# Patient Record
Sex: Female | Born: 1990 | Race: White
Health system: Northeastern US, Academic
[De-identification: ages and names within clinical notes are randomized; demographics above are authoritative.]

## PROBLEM LIST (undated history)

## (undated) DIAGNOSIS — Z720 Tobacco use: Secondary | ICD-10-CM

## (undated) HISTORY — PX: TONSILLECTOMY: SUR1361

## (undated) HISTORY — PX: HERNIA REPAIR: SHX51

## (undated) HISTORY — DX: Morbid (severe) obesity due to excess calories: E66.01

## (undated) HISTORY — DX: Tobacco use: Z72.0

## (undated) HISTORY — PX: TONSILLECTOMY AND ADENOIDECTOMY: SHX28

## (undated) HISTORY — PX: ABDOMINAL HERNIA REPAIR: SHX539

---
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
## 2011-09-25 ENCOUNTER — Encounter: Payer: Self-pay | Admitting: Pulmonology

## 2011-09-25 ENCOUNTER — Ambulatory Visit: Payer: Self-pay | Admitting: Pulmonology

## 2011-09-25 VITALS — BP 128/72 | HR 86 | Temp 97.5°F | Resp 12 | Ht 63.0 in | Wt 296.5 lb

## 2011-09-25 DIAGNOSIS — R590 Localized enlarged lymph nodes: Secondary | ICD-10-CM | POA: Insufficient documentation

## 2011-09-25 DIAGNOSIS — F17200 Nicotine dependence, unspecified, uncomplicated: Secondary | ICD-10-CM

## 2011-09-25 DIAGNOSIS — R911 Solitary pulmonary nodule: Secondary | ICD-10-CM

## 2011-09-25 LAB — CBC AND DIFFERENTIAL
Baso # K/uL: 0 10*3/uL (ref 0.0–0.1)
Basophil %: 0.3 % (ref 0.1–1.2)
Eos # K/uL: 0.1 10*3/uL (ref 0.0–0.4)
Eosinophil %: 0.8 % (ref 0.7–5.8)
Hematocrit: 42 % (ref 34–45)
Hemoglobin: 13.7 g/dL (ref 11.2–15.7)
Lymph # K/uL: 2 10*3/uL (ref 1.2–3.7)
Lymphocyte %: 20.1 % (ref 19.3–51.7)
MCV: 86 fL (ref 79–95)
Mono # K/uL: 0.8 10*3/uL (ref 0.2–0.9)
Monocyte %: 8.1 % (ref 4.7–12.5)
Neut # K/uL: 6.9 10*3/uL — ABNORMAL HIGH (ref 1.6–6.1)
Platelets: 302 10*3/uL (ref 160–370)
RBC: 4.9 MIL/uL (ref 3.9–5.2)
RDW: 13.4 % (ref 11.7–14.4)
Seg Neut %: 70.7 % (ref 34.0–71.1)
WBC: 9.8 10*3/uL (ref 4.0–10.0)

## 2011-09-25 LAB — COMPREHENSIVE METABOLIC PANEL
ALT: 22 U/L (ref 0–35)
AST: 19 U/L (ref 0–35)
Albumin: 4.2 g/dL (ref 3.5–5.2)
Alk Phos: 95 U/L (ref 35–105)
Anion Gap: 11 (ref 7–16)
Bilirubin,Total: 0.2 mg/dL (ref 0.0–1.2)
CO2: 25 mmol/L (ref 20–28)
Calcium: 8.9 mg/dL — ABNORMAL LOW (ref 9.0–10.4)
Chloride: 105 mmol/L (ref 96–108)
Creatinine: 0.58 mg/dL (ref 0.51–0.95)
GFR,Black: 151 *
GFR,Caucasian: 131 *
Glucose: 75 mg/dL (ref 60–99)
Lab: 13 mg/dL (ref 6–20)
Potassium: 4.2 mmol/L (ref 3.3–5.1)
Sodium: 141 mmol/L (ref 133–145)
Total Protein: 6.8 g/dL (ref 6.3–7.7)

## 2011-09-25 NOTE — Patient Instructions (Signed)
The reflux medication we discussed is omeprazole (trade name Prilosec).    The possible infection we discussed is histoplasmosis.      Call Dr. Leonie Man in two weeks for results if you have not heard anything.

## 2011-09-25 NOTE — Progress Notes (Signed)
Outpatient Pulmonary Consultation Note      Dear Dr. Pecola Leisure,    I had the pleasure of seeing Audrey Barnes in consultation today at [facility].  Please allow me to review the pertinent history for my records.    HPI:  As you know, Audrey Barnes is a 21 y.o. female presenting for evaluation of her pulmonary nodule.      This nodule was initially seen in 2010 on an abdominal scan performed for evaluation of a ventral hernia.  It was rearranged in September of 2012 when her hernia site was being reevaluated.  At that time, the right lower lobe pulmonary nodule was described as measuring 1 cm.  A followup dedicated chest CT scan was performed on August 23, 2011, which reports that the nodule might be somewhat larger measuring 1.9 x 0.9 cm.  Mediastinal adenopathy, and the subcarinal lymph node of 3 x 2.1 cm, as well as right hilar adenopathy of 1.6 x 1.2 cm was noted.    On presentation, she is not having any symptoms.  Specifically, no fevers, chills, sweats.  No chest pain or chest tightness.  She does have daily reflux for which she takes [place].  She does have shortness of breath with exertion.  Her skin test was negative for tuberculosis as part of her job.  No prior pneumonias or lung infections.  She does smoke, roughly one pack per day for 8 years.    Past Medical History  Past Medical History   Diagnosis Date   . Morbid obesity    . 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  Lives alone.  One pack per day x8 years.  Trying to quit.  Drinks alcohol once every 2 weeks.  No pets.      Family History  The patient has a family history of lung cancer in her paternal grandmother.    Current Medications  A complete medication list was reviewed and updated with the patient.  She is currently on Chantix.      Allergies  No Known Allergies (drug, envir, food or latex)      Review of Systems  A full 11-point ROS was performed and can be referenced in the  patient questionnaire, which I personally reviewed with the patient. Relevant findings include those above mentioned.       Physical Exam  Blood pressure 128/72, pulse 86, temperature 36.4 C (97.5 F), temperature source Temporal, resp. rate 12, height 1.6 m (5\' 3" ), weight 134.492 kg (296 lb 8 oz), SpO2 99.00%..    Gen:   Alert and in NAD.  HEENT: Anicteric, oropharynx clear without thrush.  Neck:  No cervical or supraclavicular lymphadenopathy.   Resp:  Clear to auscultation bilaterally.  No rubs or gallops    CV:    Regular S1S2,  no murmur   GI:  NABS, abd soft, mildly tender in her prior surgical site/ND, no palpable organomegaly.morbidly obese    XTR: No cyanosis, no clubbing, no edema   Neuro: Alert and oriented.  CN II-XII intact.   Derm:  No rashes.  Tattoos    Pulmonary function tests:  Her lung function today was completely normal with a FEV1 of 3.25 L for 102% predicted, a FVC of 3.97 L or 108% predicted, and a normal ratio of 82.    Imaging:  Her imaging was described above.  She has chest CT  and abdominal CT that I have not had the opportunity to review but have only seen the reports.  Her largest lymph node is the subcarinal node at 3 cm x 2 cm, and her pulmonary nodule at the right lower lobe is roughly 1 cm x 2 cm.      Active Problem List:  Patient Active Problem List   Diagnosis Code   . Pulmonary nodule 793.11   . Lymphadenopathy, mediastinal 785.6   . Smoking 305.1         Impression:  Audrey Barnes is a 21 y.o. female with a right lower lobe pulmonary nodule, draining right hilar lymph node, and subcarinal adenopathy in the setting of tobacco use.  The description suggests possible granulomatous disease.  Arguing against malignancy would be her age.  Certainly, tobacco use does increase her risk for malignancy, but with her age I think this would be extremely unlikely.  Additional risk factor would be the grandparent with a history of lung cancer.  I think reviewing the scans myself is about most  importance.  If the nodule is stable then no further followup is necessary given that it has been present for 2 years.  It growth is present, this does not remove the possibility of a granuloma, but the amount of growth would determine what followup if any is necessary.  We discussed that the lymphadenopathy to be easily biopsied with a endobronchial ultrasound approach, but that I do not think this is necessary if everything is indeed stable.  One potential cause for her granulomatous disease could be histoplasmosis given their residence close to [facility].  Sarcoidosis is also possible that the focality argues for something more infectious.  We spent 3-5 minutes discussing smoking cessation, how to remain motivated, behavioral alterations, and the appropriate use of Chantix, which she is already on.      Recommendations:  1.  My office will obtain the outside scans.  2.  Blood work today for histoplasmosis, fixation and immunodiffusion.  3.  Tentatively scan followup in 11 months with appointment at that time (this is assuming slight growth is seen in the nodule).  4.  Smoking cessation strongly encouraged.  5.  They know to contact me in 2 weeks for my impression of her scans and her blood work.    Thank you for allowing me to become involved in the care of your patient. Please don't hesitate to contact me with any questions or concerns.     Sincerely,   Casimiro Needle A. Suraj Ramdass, MD, PhD  2:11 PM  09/25/2011

## 2011-09-29 LAB — HISTOPLASMA CF/ID AB PANEL
Histoplasma Ab: DETECTED — AB
Histoplasma Mycelial AB: <1:8 {titer}
Histoplasma Yeast AB: 1:32 {titer} — ABNORMAL HIGH

## 2011-10-05 ENCOUNTER — Other Ambulatory Visit: Payer: Self-pay | Admitting: Pulmonology

## 2011-10-10 ENCOUNTER — Telehealth: Payer: Self-pay | Admitting: Pulmonology

## 2011-10-10 DIAGNOSIS — R911 Solitary pulmonary nodule: Secondary | ICD-10-CM

## 2011-10-10 NOTE — Telephone Encounter (Signed)
Please call patient at [phone] with results from blood work.

## 2011-10-12 NOTE — Telephone Encounter (Signed)
I called her with the results.  Histoplasmosis serologies positive at 1:32.  Her nodule appears perhaps more dense on the chest CT but this could be technique.  Size is 0.9 cm x 1.9 cm.  Unclear if this is granulomatous, but highly suspected.  Will back up her appointment to October and obtain a CT scan at [facility] on the same day.    We can cancel her CT scan in one year as well as her appointment at that time.

## 2011-10-16 NOTE — Telephone Encounter (Signed)
Patient is scheduled for 10/22 @ 3:30 and Scan is scheduled for 1:45pm

## 2012-03-11 ENCOUNTER — Ambulatory Visit: Admit: 2012-03-11 | Payer: Self-pay | Source: Ambulatory Visit | Admitting: Pulmonology

## 2012-03-11 ENCOUNTER — Ambulatory Visit: Payer: Self-pay | Admitting: Pulmonology

## 2012-03-11 ENCOUNTER — Telehealth: Payer: Self-pay | Admitting: Pulmonology

## 2012-03-11 NOTE — Telephone Encounter (Signed)
Dr.Nead patient states that she was not able to make appointment scheduled for 03/10/12 due to being sick and overwhelmed. She forgot all about the appointment due to other things going on. She would like to reschedule for your next available.

## 2012-03-26 ENCOUNTER — Telehealth: Payer: Self-pay | Admitting: Pulmonology

## 2012-03-26 NOTE — Telephone Encounter (Signed)
Called patient on 11/5 and  03/26/2012 to offer FUA with MD. Nead . No answer

## 2012-05-09 ENCOUNTER — Ambulatory Visit: Payer: Self-pay | Admitting: Pulmonology

## 2012-05-19 ENCOUNTER — Encounter: Payer: Self-pay | Admitting: Pulmonology

## 2012-08-04 ENCOUNTER — Other Ambulatory Visit: Payer: Self-pay | Admitting: Pulmonology

## 2012-08-04 ENCOUNTER — Telehealth: Payer: Self-pay | Admitting: Pulmonology

## 2012-08-04 NOTE — Telephone Encounter (Signed)
CT scan of the chest. Md Nead f.u    Patient was placed on scheduled for 4.16.2014 at 9:30am. Requesting her to complete a CT prior at 8:00am for MD Nead.   Please note she needed to check with her mother regarding transportation to clinic.   She will need to call back to confirm date and time.

## 2012-08-04 NOTE — Telephone Encounter (Signed)
Called patient regarding FUA in Pulmonary Clinic. Orginally scheduled to return in April of 2014, the patient missed f.u prior with MD Nead. Will call PCP office regarding missed appointments. Also not receiving return calls to the clinic. Multiple attempts through letters and VM messaging.

## 2012-08-07 ENCOUNTER — Encounter: Payer: Self-pay | Admitting: Pulmonology

## 2012-08-08 ENCOUNTER — Telehealth: Payer: Self-pay | Admitting: Pulmonology

## 2012-08-08 NOTE — Telephone Encounter (Signed)
Patient returning Lani's call. Informed patient of the change in schedule, she confirmed understanding. If there was any other reason to contact this patient, call her back at [phone] to advise.

## 2012-08-08 NOTE — Telephone Encounter (Signed)
Bumped clinic in April    New appointment on 09/08/12- 11:00am - MD Nead  CT scan of the chest same day . 9:15am- [facility]

## 2012-08-25 ENCOUNTER — Ambulatory Visit: Payer: Self-pay | Admitting: Pulmonology

## 2012-09-03 ENCOUNTER — Ambulatory Visit: Payer: Self-pay | Admitting: Pulmonology

## 2012-09-08 ENCOUNTER — Ambulatory Visit: Payer: Self-pay | Admitting: Pulmonology

## 2012-09-08 ENCOUNTER — Encounter: Payer: Self-pay | Admitting: Pulmonology

## 2012-09-08 ENCOUNTER — Ambulatory Visit
Admit: 2012-09-08 | Discharge: 2012-09-08 | Disposition: A | Discharge status: Home / Self Care | Payer: Self-pay | Source: Ambulatory Visit | Attending: Pulmonology | Admitting: Pulmonology

## 2012-09-08 VITALS — BP 118/63 | HR 96 | Temp 97.2°F | Resp 16 | Ht 63.0 in | Wt 294.0 lb

## 2012-09-08 DIAGNOSIS — F17200 Nicotine dependence, unspecified, uncomplicated: Secondary | ICD-10-CM

## 2012-09-08 NOTE — Progress Notes (Signed)
[facility] Outpatient Pulmonary Follow-Up Note      Dear Dr. Pecola Leisure,    I had the pleasure of seeing Lisbet Mccadden in follow-up today at [facility] for her lung nodule.    Relevant Pulmonary History:  - tobacco:  1ppd x 9 years, ongoing  - father died of lung cancer  - 02-19-23:  CT abd with lung nodule   - 08/23/11:  CT chest with nodule 1.9 x 0.9cm, with subcarinal and hilar adenopathy  - 09/25/2011:  Histoplasmosis serology positive    She today for scheduled followup.  She is not having any fevers or chills.  No unexpected weight loss.  She does have occasional drenching night sweats.  This is not new for her.  No skin lesions aside from a small boil on her chest wall or breast.  She does unfortunately continue to smoke roughly one pack per day.  She experienced bad dreams on Chantix and is not interested in retrialing.    A complete medication list was reviewed and updated with the patient.  Current Outpatient Prescriptions   Medication Sig   . venlafaxine (EFFEXOR-XR) 75 MG 24 hr capsule Take 150 mg by mouth daily   Swallow whole. Do not crush or chew.   . varenicline (CHANTIX) 1 MG tablet Take 1 mg by mouth 2 times daily       Allergic reactions were reviewed and updated.      Blood pressure 118/63, pulse 96, temperature 36.2 C (97.2 F), temperature source Tympanic, resp. rate 16, height 1.6 m (5\' 3" ), weight 133.358 kg (294 lb), SpO2 96.00%..    Gen:   NAD.  HEENT: Anicteric, oropharynx clear, without thrush  Neck:  No cervical or supraclavicular lymphadenopathy.    Resp:  CTA bil.  No wheeze or rales.   CV:    Regular S1S2,  No murmur, rubs, or gallops.   GI:  NABS, abd soft, NT/ND.   Extremities: No cyanosis, no clubbing, so significant edema.   Neuro: Alert and appropriate without focal findings    Pulmonary function tests:  Normal lung function May 2013    Imaging studies since our last visit:  We reviewed her CT scan from today in the office together.  Do not appreciate any significant change  in the pulmonary nodule.  The lymphadenopathy appears improved.  Since then, the official report has become available:  FINDINGS:   Neck base: Normal.   Mediastinum and lymph nodes: No lymphadenopathy.   Cardiovascular structures: Normal.   Pleura and Pleural space: Normal.   Central Airways: Normal.   Lungs: There is a stable 1.9 x 0.9 cm nodule in the posterior right   lower lobe on image 2-81. No other nodules are identified. There is   no other focal lung disease.   Upper abdomen: Unremarkable.   Soft Tissues/Musculoskeletal: No acute soft tissue or osseous   abnormalities.   IMPRESSION:   IMPRESSION:   The 1.9 x 0.9 mm nodule in the posterior right lower lobe is   unchanged in size compared to the outside CT. No new abnormalities   identified.      Active Problem List:  Patient Active Problem List   Diagnosis Code   . Pulmonary nodule 793.11   . Lymphadenopathy, mediastinal 785.6   . Smoking 305.1       Impression:  Jerry Akram is a 22 y.o. female smoker with a pulmonary nodule discovered incidentally.  She continues to smoke, and her nodule is stable  at one year.  She is serologically positive for histoplasmosis, which would be consistent with the parents of the lesion in the previously seen lymphadenopathy on the same side.  This adenopathy has resolved.  Histoplasmosis without symptoms does not require any specific intervention.  With stability of her nodule at 12 months combined with her serologies being positive for histoplasmosis as well as her age, I do not think further chest imaging is necessary.  The risk-benefit ratio of exposing her to additional radiation factors in, and further reinforces the lack of need for further followup.     Recommendations:  1.  No further chest imaging required unless new symptoms develop.  2.  Smoking cessation stressed.  Methods of cessation reviewed.  3.  No further followup in pulmonary clinic necessary unless new pulmonary symptoms arise.    Thank you for allowing me  to  have been involved in the care of your patient. Please don't hesitate to contact me with any questions or concerns.     Sincerely,   Lenon Curt, MD, PhD

## 2012-09-08 NOTE — Patient Instructions (Signed)
Histoplasmosis is the fungal infection we discussed.    Work on the smoking cessation.  You can do it!    Call Dr. Leonie Man the end of this week if you have not heard from him.

## 2012-09-22 ENCOUNTER — Telehealth: Payer: Self-pay | Admitting: Pulmonology

## 2012-09-22 NOTE — Telephone Encounter (Signed)
Patient calling to go over her most recent CT scan results with Dr.Nead. Jinny requests that he contact her back at [phone] to review together. Thank you!!

## 2012-09-25 NOTE — Telephone Encounter (Signed)
Patient called again for the CT results. Please contact patient at [phone].

## 2012-09-25 NOTE — Telephone Encounter (Signed)
I called her with the final CT results.  Images reviewed in the office - stable.  No sx.  Granulomatous in appearance.  She knows to call if she develops any sx - wt loss, night sweats, persistent fevers, etc.  All questions addressed to her content.

## 2014-11-07 ENCOUNTER — Emergency Department (HOSPITAL_BASED_OUTPATIENT_CLINIC_OR_DEPARTMENT_OTHER)
Admission: EM | Admit: 2014-11-07 | Discharge: 2014-11-08 | Disposition: A | Discharge status: Home / Self Care | Payer: Self-pay | Attending: Emergency Medicine | Admitting: Emergency Medicine

## 2014-11-07 ENCOUNTER — Encounter (HOSPITAL_BASED_OUTPATIENT_CLINIC_OR_DEPARTMENT_OTHER): Payer: Self-pay | Admitting: Emergency Medicine

## 2014-11-07 DIAGNOSIS — E669 Obesity, unspecified: Secondary | ICD-10-CM | POA: Insufficient documentation

## 2014-11-07 DIAGNOSIS — R222 Localized swelling, mass and lump, trunk: Secondary | ICD-10-CM

## 2014-11-07 DIAGNOSIS — R911 Solitary pulmonary nodule: Secondary | ICD-10-CM | POA: Insufficient documentation

## 2014-11-07 DIAGNOSIS — Z720 Tobacco use: Secondary | ICD-10-CM | POA: Insufficient documentation

## 2014-11-07 DIAGNOSIS — N938 Other specified abnormal uterine and vaginal bleeding: Secondary | ICD-10-CM | POA: Insufficient documentation

## 2014-11-07 DIAGNOSIS — Z3202 Encounter for pregnancy test, result negative: Secondary | ICD-10-CM | POA: Insufficient documentation

## 2014-11-07 DIAGNOSIS — R1032 Left lower quadrant pain: Secondary | ICD-10-CM | POA: Insufficient documentation

## 2014-11-07 NOTE — ED Notes (Signed)
Patient states that she has had vaginal bleeding x 2 months, today is heavier and having left lower quad pain

## 2014-11-08 ENCOUNTER — Emergency Department (HOSPITAL_BASED_OUTPATIENT_CLINIC_OR_DEPARTMENT_OTHER): Payer: Self-pay

## 2014-11-08 LAB — CBC WITH DIFFERENTIAL/PLATELET
BASOS ABS: 0 10*3/uL (ref 0.0–0.1)
Basophils Relative: 0 % (ref 0–1)
EOS ABS: 0.2 10*3/uL (ref 0.0–0.7)
EOS PCT: 2 % (ref 0–5)
HCT: 40.2 % (ref 36.0–46.0)
Hemoglobin: 12.9 g/dL (ref 12.0–15.0)
LYMPHS PCT: 30 % (ref 12–46)
Lymphs Abs: 2.9 10*3/uL (ref 0.7–4.0)
MCH: 27.5 pg (ref 26.0–34.0)
MCHC: 32.1 g/dL (ref 30.0–36.0)
MCV: 85.7 fL (ref 78.0–100.0)
MONO ABS: 0.8 10*3/uL (ref 0.1–1.0)
Monocytes Relative: 8 % (ref 3–12)
Neutro Abs: 5.7 10*3/uL (ref 1.7–7.7)
Neutrophils Relative %: 60 % (ref 43–77)
Platelets: 363 10*3/uL (ref 150–400)
RBC: 4.69 MIL/uL (ref 3.87–5.11)
RDW: 13.8 % (ref 11.5–15.5)
WBC: 9.7 10*3/uL (ref 4.0–10.5)

## 2014-11-08 LAB — URINALYSIS, ROUTINE W REFLEX MICROSCOPIC
Glucose, UA: NEGATIVE mg/dL
Ketones, ur: 15 mg/dL — AB
Leukocytes, UA: NEGATIVE
NITRITE: NEGATIVE
Protein, ur: 30 mg/dL — AB
SPECIFIC GRAVITY, URINE: 1.034 — AB (ref 1.005–1.030)
UROBILINOGEN UA: 0.2 mg/dL (ref 0.0–1.0)
pH: 5.5 (ref 5.0–8.0)

## 2014-11-08 LAB — COMPREHENSIVE METABOLIC PANEL
ALK PHOS: 52 U/L (ref 38–126)
ALT: 15 U/L (ref 14–54)
AST: 19 U/L (ref 15–41)
Albumin: 4.1 g/dL (ref 3.5–5.0)
Anion gap: 10 (ref 5–15)
BILIRUBIN TOTAL: 0.5 mg/dL (ref 0.3–1.2)
BUN: 10 mg/dL (ref 6–20)
CHLORIDE: 107 mmol/L (ref 101–111)
CO2: 24 mmol/L (ref 22–32)
Calcium: 9.4 mg/dL (ref 8.9–10.3)
Creatinine, Ser: 0.55 mg/dL (ref 0.44–1.00)
GFR calc non Af Amer: >60 mL/min (ref >60)
GLUCOSE: 106 mg/dL — AB (ref 65–99)
Potassium: 3.5 mmol/L (ref 3.5–5.1)
SODIUM: 141 mmol/L (ref 135–145)
Total Protein: 7.2 g/dL (ref 6.5–8.1)

## 2014-11-08 LAB — PREGNANCY, URINE: Preg Test, Ur: NEGATIVE

## 2014-11-08 LAB — URINE MICROSCOPIC-ADD ON

## 2014-11-08 LAB — WET PREP, GENITAL
Trich, Wet Prep: NONE SEEN
YEAST WET PREP: NONE SEEN

## 2014-11-08 MED ORDER — IOHEXOL 300 MG/ML  SOLN
100.0000 mL | Freq: Once | INTRAMUSCULAR | Status: AC | PRN
  Administered 2014-11-08: 100 mL via INTRAVENOUS

## 2014-11-08 MED ORDER — ONDANSETRON HCL 4 MG/2ML IJ SOLN
4.0000 mg | Freq: Once | INTRAMUSCULAR | Status: AC
  Administered 2014-11-08: 4 mg via INTRAVENOUS
  Filled 2014-11-08: qty 2

## 2014-11-08 MED ORDER — OXYCODONE-ACETAMINOPHEN 5-325 MG PO TABS: 1.0000 | ORAL_TABLET | Freq: Four times a day (QID) | ORAL | Status: AC | PRN

## 2014-11-08 MED ORDER — MORPHINE SULFATE 4 MG/ML IJ SOLN
4.0000 mg | Freq: Once | INTRAMUSCULAR | Status: AC
  Administered 2014-11-08: 4 mg via INTRAVENOUS
  Filled 2014-11-08: qty 1

## 2014-11-08 NOTE — ED Notes (Signed)
Tolerated po fluids well.   

## 2014-11-08 NOTE — ED Provider Notes (Signed)
CSN: [id number]     Arrival date & time 11/07/14  2329 History  This chart was scribed for Rita Baton, MD by Doreatha Martin, ED Scribe. This patient was seen in room MH01/MH01 and the patient's care was started at 12:24 AM.     Chief Complaint  Patient presents with  . Vaginal Bleeding   The history is provided by the patient. No language interpreter was used.    HPI Comments: Rita Boyd is a 24 y.o. female with no chronic medical conditions who presents to the Emergency Department complaining of moderate, sharp, intermittent LLQ 8/10 abdominal pain onset yesterday and worsened today. She states associated diarrhea (onset one week ago and since resolved- she states that she is now constipated), dizziness onset today and vaginal bleeding onset 2 months ago. She states that she soaks 6-7 pads a day (approx. 48/week). She states that she took Tylenol with no relief. Pt is sexually active. She is not currently followed by a PCP or OB/GYN because she just moved here from [place]. She is not currently on any medications. Pt denies pregnancy. She also denies dysuria, hematuria, nausea and vomiting.  Denies any vaginal discharge.  History reviewed. No pertinent past medical history. Past Surgical History  Procedure Laterality Date  . Hernia repair    . Tonsillectomy     History reviewed. No pertinent family history. History  Substance Use Topics  . Smoking status: Current Every Day Smoker  . Smokeless tobacco: Not on file  . Alcohol Use: No   OB History    No data available     Review of Systems  Constitutional: Negative for fever.  Respiratory: Negative for chest tightness and shortness of breath.   Cardiovascular: Negative for chest pain.  Gastrointestinal: Positive for abdominal pain. Negative for nausea and vomiting.  Genitourinary: Positive for vaginal bleeding. Negative for dysuria and vaginal discharge.  Neurological: Positive for dizziness. Negative for headaches.  All other  systems reviewed and are negative.     Allergies  Review of patient's allergies indicates no known allergies.  Home Medications   Prior to Admission medications   Medication Sig Start Date End Date Taking? Authorizing Provider  oxyCODONE-acetaminophen (PERCOCET/ROXICET) 5-325 MG per tablet Take 1 tablet by mouth every 6 (six) hours as needed for severe pain. 11/08/14   Rita Baton, MD   BP 118/70 mmHg  Pulse 73  Temp(Src) 97.8 F (36.6 C) (Oral)  Resp 16  Ht 5\' 3"  (1.6 m)  Wt 288 lb (130.636 kg)  BMI 51.03 kg/m2  SpO2 100%  LMP 11/07/2014 Physical Exam  Constitutional: She is oriented to person, place, and time. No distress.  obese  HENT:  Head: Normocephalic and atraumatic.  Cardiovascular: Normal rate, regular rhythm and normal heart sounds.   Pulmonary/Chest: Effort normal. No respiratory distress. She has no wheezes.  Abdominal: Soft. Bowel sounds are normal. There is tenderness. There is no rebound and no guarding.  Left lower quadrant tenderness to palpation without rebound or guarding  Genitourinary: Vagina normal.  Moderate vaginal bleeding, no cervical motion or adnexal tenderness  Musculoskeletal: She exhibits no edema.  Neurological: She is alert and oriented to person, place, and time.  Skin: Skin is warm and dry.  Psychiatric: She has a normal mood and affect.  Nursing note and vitals reviewed.   ED Course  Procedures (including critical care time) DIAGNOSTIC STUDIES: Oxygen Saturation is 100% on RA, normal by my interpretation.    COORDINATION OF CARE: 12:28 AM  Discussed treatment plan with pt at bedside and pt agreed to plan.   Labs Review Labs Reviewed  WET PREP, GENITAL - Abnormal; Notable for the following:    Clue Cells Wet Prep HPF POC FEW (*)    WBC, Wet Prep HPF POC FEW (*)    All other components within normal limits  URINALYSIS, ROUTINE W REFLEX MICROSCOPIC (NOT AT [facility]) - Abnormal; Notable for the following:    Color, Urine RED  (*)    APPearance CLOUDY (*)    Specific Gravity, Urine 1.034 (*)    Hgb urine dipstick LARGE (*)    Bilirubin Urine SMALL (*)    Ketones, ur 15 (*)    Protein, ur 30 (*)    All other components within normal limits  COMPREHENSIVE METABOLIC PANEL - Abnormal; Notable for the following:    Glucose, Bld 106 (*)    All other components within normal limits  URINE MICROSCOPIC-ADD ON - Abnormal; Notable for the following:    Squamous Epithelial / LPF FEW (*)    Bacteria, UA MANY (*)    All other components within normal limits  PREGNANCY, URINE  CBC WITH DIFFERENTIAL/PLATELET  GC/CHLAMYDIA PROBE AMP ([place]) NOT AT [facility]    Imaging Review Ct Abdomen Pelvis W Contrast  11/08/2014   CLINICAL DATA:  Patient with left lower quadrant abdominal pain, worsening today. Associated diarrhea.  EXAM: CT ABDOMEN AND PELVIS WITH CONTRAST  TECHNIQUE: Multidetector CT imaging of the abdomen and pelvis was performed using the standard protocol following bolus administration of intravenous contrast.  CONTRAST:  OMNIPAQUE IOHEXOL 300 MG/ML  SOLN  COMPARISON:  None.  FINDINGS: Lower chest: There is a 10 mm subpleural nodule within the right lower lobe (image 12; series 2). Normal heart size.  Hepatobiliary: Liver is normal in size and contour without focal hepatic lesion identified. Gallbladder is unremarkable. No intrahepatic or extrahepatic biliary ductal dilatation.  Pancreas: Unremarkable  Spleen: Unremarkable  Adrenals/Urinary Tract: Kidneys are symmetric in size. The adrenal glands are normal. No hydronephrosis. Urinary bladder is unremarkable.  Stomach/Bowel: The appendix is normal. No abnormal bowel wall thickening or evidence for bowel obstruction. No free fluid or free intraperitoneal air.  Vascular/Lymphatic: Normal caliber abdominal aorta. No retroperitoneal lymphadenopathy.  Other: Uterus and adnexal structures are unremarkable.  Musculoskeletal: No aggressive or acute appearing osseous lesions.   IMPRESSION: 10 mm subpleural nodule right lower lobe. This may potentially represent focal area of infection or inflammation. Pulmonary infarct not entirely excluded. Recommend initial followup chest CT in 3 months to evaluate for interval change or stability.  No acute process within the abdomen or pelvis.   Electronically Signed   By: Annia Belt M.D.   On: 11/08/2014 03:34     EKG Interpretation None      MDM   Final diagnoses:  Dysfunctional uterine bleeding  LLQ abdominal pain  Pleural nodule    Patient presents with vaginal bleeding 2 months and onset of left lower quadrant pain yesterday with diarrhea. Nontoxic on exam. Tender without signs of peritonitis. Basic labwork obtained and is reassuring.  No evidence of urinary tract infection. Hemoglobin is 12.9.  Vaginal exam is reassuring.  On recheck, patient continues to endorse pain. CT abdomen pelvis obtained and is largely reassuring with the exception of a 10 mm subpleural nodule. Patient reports that she is aware of this. Discussed with patient results. [facility] She may need ultrasound to evaluate further. Low suspicion of this  time for acute ovarian torsion as the cause of patient's pain. Suspect possible endometriosis or dysfunctional uterine bleeding and menorrhagia as the source of the patient's pain. Patient will be controlled symptomatically and was given follow-up information.  After history, exam, and medical workup I feel the patient has been appropriately medically screened and is safe for discharge home. Pertinent diagnoses were discussed with the patient. Patient was given return precautions.  I personally performed the services described in this documentation, which was scribed in my presence. The recorded information has been reviewed and is accurate.   Rita Baton, MD 11/08/14 [phone]

## 2014-11-08 NOTE — ED Notes (Signed)
Sprite given 

## 2014-11-08 NOTE — ED Notes (Signed)
Patient transported to CT 

## 2014-11-08 NOTE — Discharge Instructions (Signed)
You were seen today for abdominal pain as well as ongoing vaginal bleeding. Workup is largely unremarkable including a CT scan.  You may ultimately need an ultrasound to evaluate her ovaries. Due to given pain medication. You need to follow-up with women's clinic for further evaluation if your ongoing symptoms. Regarding her vaginal bleeding, your blood tests are reassuring. Again following up with women's clinic is recommended.  Abdominal Pain, Women Abdominal (stomach, pelvic, or belly) pain can be caused by many things. It is important to tell your doctor:  The location of the pain.  Does it come and go or is it present all the time?  Are there things that start the pain (eating certain foods, exercise)?  Are there other symptoms associated with the pain (fever, nausea, vomiting, diarrhea)? All of this is helpful to know when trying to find the cause of the pain. CAUSES   Stomach: virus or bacteria infection, or ulcer.  Intestine: appendicitis (inflamed appendix), regional ileitis (Crohn's disease), ulcerative colitis (inflamed colon), irritable bowel syndrome, diverticulitis (inflamed diverticulum of the colon), or cancer of the stomach or intestine.  Gallbladder disease or stones in the gallbladder.  Kidney disease, kidney stones, or infection.  Pancreas infection or cancer.  Fibromyalgia (pain disorder).  Diseases of the female organs:  Uterus: fibroid (non-cancerous) tumors or infection.  Fallopian tubes: infection or tubal pregnancy.  Ovary: cysts or tumors.  Pelvic adhesions (scar tissue).  Endometriosis (uterus lining tissue growing in the pelvis and on the pelvic organs).  Pelvic congestion syndrome (female organs filling up with blood just before the menstrual period).  Pain with the menstrual period.  Pain with ovulation (producing an egg).  Pain with an IUD (intrauterine device, birth control) in the uterus.  Cancer of the female organs.  Functional pain  (pain not caused by a disease, may improve without treatment).  Psychological pain.  Depression. DIAGNOSIS  Your doctor will decide the seriousness of your pain by doing an examination.  Blood tests.  X-rays.  Ultrasound.  CT scan (computed tomography, special type of X-ray).  MRI (magnetic resonance imaging).  Cultures, for infection.  Barium enema (dye inserted in the large intestine, to better view it with X-rays).  Colonoscopy (looking in intestine with a lighted tube).  Laparoscopy (minor surgery, looking in abdomen with a lighted tube).  Major abdominal exploratory surgery (looking in abdomen with a large incision). TREATMENT  The treatment will depend on the cause of the pain.   Many cases can be observed and treated at home.  Over-the-counter medicines recommended by your caregiver.  Prescription medicine.  Antibiotics, for infection.  Birth control pills, for painful periods or for ovulation pain.  Hormone treatment, for endometriosis.  Nerve blocking injections.  Physical therapy.  Antidepressants.  Counseling with a psychologist or psychiatrist.  Minor or major surgery. HOME CARE INSTRUCTIONS   Do not take laxatives, unless directed by your caregiver.  Take over-the-counter pain medicine only if ordered by your caregiver. Do not take aspirin because it can cause an upset stomach or bleeding.  Try a clear liquid diet (broth or water) as ordered by your caregiver. Slowly move to a bland diet, as tolerated, if the pain is related to the stomach or intestine.  Have a thermometer and take your temperature several times a day, and record it.  Bed rest and sleep, if it helps the pain.  Avoid sexual intercourse, if it causes pain.  Avoid stressful situations.  Keep your follow-up appointments and tests, as your  caregiver orders.  If the pain does not go away with medicine or surgery, you may try:  Acupuncture.  Relaxation exercises (yoga,  meditation).  Group therapy.  Counseling. SEEK MEDICAL CARE IF:   You notice certain foods cause stomach pain.  Your home care treatment is not helping your pain.  You need stronger pain medicine.  You want your IUD removed.  You feel faint or lightheaded.  You develop nausea and vomiting.  You develop a rash.  You are having side effects or an allergy to your medicine. SEEK IMMEDIATE MEDICAL CARE IF:   Your pain does not go away or gets worse.  You have a fever.  Your pain is felt only in portions of the abdomen. The right side could possibly be appendicitis. The left lower portion of the abdomen could be colitis or diverticulitis.  You are passing blood in your stools (bright red or black tarry stools, with or without vomiting).  You have blood in your urine.  You develop chills, with or without a fever.  You pass out. MAKE SURE YOU:   Understand these instructions.  Will watch your condition.  Will get help right away if you are not doing well or get worse. Document Released: 03/04/2007 Document Revised: 09/21/2013 Document Reviewed: 03/24/2009 [facility] Patient Information 2015 [place], [place]. This information is not intended to replace advice given to you by your health care provider. Make sure you discuss any questions you have with your health care provider.

## 2014-11-09 LAB — GC/CHLAMYDIA PROBE AMP (~~LOC~~) NOT AT ARMC
Chlamydia: NEGATIVE
Neisseria Gonorrhea: NEGATIVE

## 2015-04-11 ENCOUNTER — Encounter (HOSPITAL_BASED_OUTPATIENT_CLINIC_OR_DEPARTMENT_OTHER): Payer: Self-pay | Admitting: *Deleted

## 2015-04-11 ENCOUNTER — Other Ambulatory Visit: Payer: Self-pay

## 2015-04-11 ENCOUNTER — Emergency Department (HOSPITAL_BASED_OUTPATIENT_CLINIC_OR_DEPARTMENT_OTHER)
Admission: EM | Admit: 2015-04-11 | Discharge: 2015-04-11 | Disposition: A | Discharge status: Home / Self Care | Payer: Self-pay | Attending: Emergency Medicine | Admitting: Emergency Medicine

## 2015-04-11 ENCOUNTER — Emergency Department (HOSPITAL_BASED_OUTPATIENT_CLINIC_OR_DEPARTMENT_OTHER): Payer: Self-pay

## 2015-04-11 DIAGNOSIS — Z3202 Encounter for pregnancy test, result negative: Secondary | ICD-10-CM | POA: Insufficient documentation

## 2015-04-11 DIAGNOSIS — F17200 Nicotine dependence, unspecified, uncomplicated: Secondary | ICD-10-CM | POA: Insufficient documentation

## 2015-04-11 DIAGNOSIS — K5900 Constipation, unspecified: Secondary | ICD-10-CM | POA: Insufficient documentation

## 2015-04-11 DIAGNOSIS — R079 Chest pain, unspecified: Secondary | ICD-10-CM | POA: Insufficient documentation

## 2015-04-11 DIAGNOSIS — R112 Nausea with vomiting, unspecified: Secondary | ICD-10-CM

## 2015-04-11 DIAGNOSIS — R1013 Epigastric pain: Secondary | ICD-10-CM | POA: Insufficient documentation

## 2015-04-11 DIAGNOSIS — R1012 Left upper quadrant pain: Secondary | ICD-10-CM | POA: Insufficient documentation

## 2015-04-11 LAB — COMPREHENSIVE METABOLIC PANEL
ALBUMIN: 3.7 g/dL (ref 3.5–5.0)
ALT: 16 U/L (ref 14–54)
ANION GAP: 8 (ref 5–15)
AST: 16 U/L (ref 15–41)
Alkaline Phosphatase: 62 U/L (ref 38–126)
BILIRUBIN TOTAL: 0.5 mg/dL (ref 0.3–1.2)
BUN: 9 mg/dL (ref 6–20)
CALCIUM: 8.5 mg/dL — AB (ref 8.9–10.3)
CHLORIDE: 104 mmol/L (ref 101–111)
CO2: 24 mmol/L (ref 22–32)
Creatinine, Ser: 0.46 mg/dL (ref 0.44–1.00)
GFR calc non Af Amer: >60 mL/min (ref >60)
Glucose, Bld: 93 mg/dL (ref 65–99)
Potassium: 3.2 mmol/L — ABNORMAL LOW (ref 3.5–5.1)
Sodium: 136 mmol/L (ref 135–145)
Total Protein: 7.1 g/dL (ref 6.5–8.1)

## 2015-04-11 LAB — CBC WITH DIFFERENTIAL/PLATELET
BASOS ABS: 0 10*3/uL (ref 0.0–0.1)
Basophils Relative: 0 %
Eosinophils Absolute: 0 10*3/uL (ref 0.0–0.7)
Eosinophils Relative: 0 %
HEMATOCRIT: 38.6 % (ref 36.0–46.0)
HEMOGLOBIN: 12.1 g/dL (ref 12.0–15.0)
Lymphocytes Relative: 17 %
Lymphs Abs: 1 10*3/uL (ref 0.7–4.0)
MCH: 26 pg (ref 26.0–34.0)
MCHC: 31.3 g/dL (ref 30.0–36.0)
MCV: 83 fL (ref 78.0–100.0)
Monocytes Absolute: 0.6 10*3/uL (ref 0.1–1.0)
Monocytes Relative: 9 %
NEUTROS ABS: 4.3 10*3/uL (ref 1.7–7.7)
NEUTROS PCT: 74 %
PLATELETS: 296 10*3/uL (ref 150–400)
RBC: 4.65 MIL/uL (ref 3.87–5.11)
RDW: 14.9 % (ref 11.5–15.5)
WBC: 5.9 10*3/uL (ref 4.0–10.5)

## 2015-04-11 LAB — URINALYSIS, ROUTINE W REFLEX MICROSCOPIC
Bilirubin Urine: NEGATIVE
GLUCOSE, UA: NEGATIVE mg/dL
HGB URINE DIPSTICK: NEGATIVE
Ketones, ur: NEGATIVE mg/dL
LEUKOCYTES UA: NEGATIVE
Nitrite: NEGATIVE
PH: 6 (ref 5.0–8.0)
PROTEIN: NEGATIVE mg/dL
SPECIFIC GRAVITY, URINE: 1.008 (ref 1.005–1.030)

## 2015-04-11 LAB — LIPASE, BLOOD: LIPASE: 20 U/L (ref 11–51)

## 2015-04-11 LAB — PREGNANCY, URINE: Preg Test, Ur: NEGATIVE

## 2015-04-11 MED ORDER — IOHEXOL 300 MG/ML  SOLN
25.0000 mL | Freq: Once | INTRAMUSCULAR | Status: AC | PRN
  Administered 2015-04-11: 25 mL via ORAL

## 2015-04-11 MED ORDER — ONDANSETRON 4 MG PO TBDP: ORAL_TABLET | ORAL | Status: AC

## 2015-04-11 MED ORDER — IOHEXOL 300 MG/ML  SOLN
100.0000 mL | Freq: Once | INTRAMUSCULAR | Status: AC | PRN
  Administered 2015-04-11: 100 mL via INTRAVENOUS

## 2015-04-11 NOTE — Discharge Instructions (Signed)
Nausea and Vomiting °Nausea is a sick feeling that often comes before throwing up (vomiting). Vomiting is a reflex where stomach contents come out of your mouth. Vomiting can cause severe loss of body fluids (dehydration). Children and elderly adults can become dehydrated quickly, especially if they also have diarrhea. Nausea and vomiting are symptoms of a condition or disease. It is important to find the cause of your symptoms. °CAUSES  °· Direct irritation of the stomach lining. This irritation can result from increased acid production (gastroesophageal reflux disease), infection, food poisoning, taking certain medicines (such as nonsteroidal anti-inflammatory drugs), alcohol use, or tobacco use. °· Signals from the brain. These signals could be caused by a headache, heat exposure, an inner ear disturbance, increased pressure in the brain from injury, infection, a tumor, or a concussion, pain, emotional stimulus, or metabolic problems. °· An obstruction in the gastrointestinal tract (bowel obstruction). °· Illnesses such as diabetes, hepatitis, gallbladder problems, appendicitis, kidney problems, cancer, sepsis, atypical symptoms of a heart attack, or eating disorders. °· Medical treatments such as chemotherapy and radiation. °· Receiving medicine that makes you sleep (general anesthetic) during surgery. °DIAGNOSIS °Your caregiver may ask for tests to be done if the problems do not improve after a few days. Tests may also be done if symptoms are severe or if the reason for the nausea and vomiting is not clear. Tests may include: °· Urine tests. °· Blood tests. °· Stool tests. °· Cultures (to look for evidence of infection). °· X-rays or other imaging studies. °Test results can help your caregiver make decisions about treatment or the need for additional tests. °TREATMENT °You need to stay well hydrated. Drink frequently but in small amounts. You may wish to drink water, sports drinks, clear broth, or eat frozen  ice pops or gelatin dessert to help stay hydrated. When you eat, eating slowly may help prevent nausea. There are also some antinausea medicines that may help prevent nausea. °HOME CARE INSTRUCTIONS  °· Take all medicine as directed by your caregiver. °· If you do not have an appetite, do not force yourself to eat. However, you must continue to drink fluids. °· If you have an appetite, eat a normal diet unless your caregiver tells you differently. °· Eat a variety of complex carbohydrates (rice, wheat, potatoes, bread), lean meats, yogurt, fruits, and vegetables. °· Avoid high-fat foods because they are more difficult to digest. °· Drink enough water and fluids to keep your urine clear or pale yellow. °· If you are dehydrated, ask your caregiver for specific rehydration instructions. Signs of dehydration may include: °· Severe thirst. °· Dry lips and mouth. °· Dizziness. °· Dark urine. °· Decreasing urine frequency and amount. °· Confusion. °· Rapid breathing or pulse. °SEEK IMMEDIATE MEDICAL CARE IF:  °· You have blood or brown flecks (like coffee grounds) in your vomit. °· You have black or bloody stools. °· You have a severe headache or stiff neck. °· You are confused. °· You have severe abdominal pain. °· You have chest pain or trouble breathing. °· You do not urinate at least once every 8 hours. °· You develop cold or clammy skin. °· You continue to vomit for longer than 24 to 48 hours. °· You have a fever. °MAKE SURE YOU:  °· Understand these instructions. °· Will watch your condition. °· Will get help right away if you are not doing well or get worse. °  °This information is not intended to replace advice given to you by your health care provider. Make sure   you discuss any questions you have with your health care provider. °  °Document Released: 05/07/2005 Document Revised: 07/30/2011 Document Reviewed: 10/04/2010 °Elsevier Interactive Patient Education ©2016 [employer] ° °Abdominal Pain, Adult °Many  things can cause abdominal pain. Usually, abdominal pain is not caused by a disease and will improve without treatment. It can often be observed and treated at home. Your health care provider will do a physical exam and possibly order blood tests and X-rays to help determine the seriousness of your pain. However, in many cases, more time must pass before a clear cause of the pain can be found. Before that point, your health care provider may not know if you need more testing or further treatment. °HOME CARE INSTRUCTIONS °Monitor your abdominal pain for any changes. The following actions may help to alleviate any discomfort you are experiencing: °· Only take over-the-counter or prescription medicines as directed by your health care provider. °· Do not take laxatives unless directed to do so by your health care provider. °· Try a clear liquid diet (broth, tea, or water) as directed by your health care provider. Slowly move to a bland diet as tolerated. °SEEK MEDICAL CARE IF: °· You have unexplained abdominal pain. °· You have abdominal pain associated with nausea or diarrhea. °· You have pain when you urinate or have a bowel movement. °· You experience abdominal pain that wakes you in the night. °· You have abdominal pain that is worsened or improved by eating food. °· You have abdominal pain that is worsened with eating fatty foods. °· You have a fever. °SEEK IMMEDIATE MEDICAL CARE IF: °· Your pain does not go away within 2 hours. °· You keep throwing up (vomiting). °· Your pain is felt only in portions of the abdomen, such as the right side or the left lower portion of the abdomen. °· You pass bloody or black tarry stools. °MAKE SURE YOU: °· Understand these instructions. °· Will watch your condition. °· Will get help right away if you are not doing well or get worse. °  °This information is not intended to replace advice given to you by your health care provider. Make sure you discuss any questions you have with  your health care provider. °  °Document Released: 02/14/2005 Document Revised: 01/26/2015 Document Reviewed: 01/14/2013 °Elsevier Interactive Patient Education ©2016 [employer] Inc. ° °

## 2015-04-11 NOTE — ED Notes (Addendum)
Left upper abdominal pain since this am. Vomiting last night. Last time she felt like this was when she was younger and the diagnosis was constipation.

## 2015-04-11 NOTE — ED Provider Notes (Signed)
CSN: [id number]     Arrival date & time 04/11/15  1254 History  By signing my name below, I, Rita Boyd, attest that this documentation has been prepared under the direction and in the presence of Mirian Mo, MD. Electronically Signed: Tanda Boyd, ED Scribe. 04/11/2015. 3:44 PM.  Chief Complaint  Patient presents with  . Chest Pain  . Abdominal Pain   Patient is a 24 y.o. female presenting with abdominal pain. The history is provided by the patient. No language interpreter was used.  Abdominal Pain Pain location:  LUQ Pain severity:  Severe Onset quality:  Gradual Duration:  1 day Timing:  Constant Progression:  Unchanged Chronicity:  New Context: not sick contacts and not suspicious food intake   Associated symptoms: chest pain, constipation, nausea and vomiting   Associated symptoms: no cough, no diarrhea, no dysuria, no hematuria and no sore throat      HPI Comments: Rita Boyd is a 24 y.o. female brought in by ambulance, who presents to the Emergency Department complaining of gradual onset, constant, LUQ abdominal pain that began last night prior to going to sleep. Pt also complains of diaphoresis, nausea, and vomiting that began shortly after the abdominal pain started. She reports that she has had some constipation as well. Pt had a small bowel movement this morning but cannot say when her last normal was. As a child she had a small bowel obstruction that did not require surgery. Pt states her symptoms do not feel similar to previous SBO. Denies diarrhea, fever, cough, congestion, sore throat, hematuria, dysuria, or any other associated symptoms. No recent sick contact with similar symptoms. No LNMP: 1 week ago. PSHx hiatal hernia repair (x2).  History reviewed. No pertinent past medical history. Past Surgical History  Procedure Laterality Date  . Hernia repair    . Tonsillectomy     No family history on file. Social History  Substance Use Topics  . Smoking  status: Current Every Day Smoker  . Smokeless tobacco: None  . Alcohol Use: No   OB History    No data available     Review of Systems  HENT: Negative for congestion and sore throat.   Respiratory: Negative for cough.   Cardiovascular: Positive for chest pain.  Gastrointestinal: Positive for nausea, vomiting, abdominal pain and constipation. Negative for diarrhea.  Genitourinary: Negative for dysuria and hematuria.  All other systems reviewed and are negative.  Allergies  Review of patient's allergies indicates no known allergies.  Home Medications   Prior to Admission medications   Medication Sig Start Date End Date Taking? Authorizing Provider  ondansetron (ZOFRAN ODT) 4 MG disintegrating tablet  ODT q4 hours prn nausea/vomit 04/11/15   Mirian Mo, MD  oxyCODONE-acetaminophen (PERCOCET/ROXICET) 5-325 MG per tablet Take 1 tablet by mouth every 6 (six) hours as needed for severe pain. 11/08/14   [name], MD   Triage Vitals: BP 100/71 mmHg  Pulse 97  Temp(Src) 98.1 F (36.7 C) (Oral)  Resp 20  Ht  (1.6 m)  Wt 288 lb (130.636 kg)  BMI 51.03 kg/m2  SpO2 100%  LMP 03/28/2015   Physical Exam  Constitutional: She is oriented to person, place, and time. She appears well-developed and well-nourished.  HENT:  Head: Normocephalic and atraumatic.  Right Ear: External ear normal.  Left Ear: External ear normal.  Eyes: Conjunctivae and EOM are normal. Pupils are equal, round, and reactive to light.  Neck: Normal range of motion. Neck supple.  Cardiovascular: Normal  rate, regular rhythm, normal heart sounds and intact distal pulses.   Pulmonary/Chest: Effort normal and breath sounds normal.  Abdominal: Soft. Bowel sounds are normal. There is tenderness in the epigastric area and left upper quadrant.  Musculoskeletal: Normal range of motion.  Neurological: She is alert and oriented to person, place, and time.  Skin: Skin is warm and dry.  Vitals  reviewed.   ED Course  Procedures (including critical care time)  DIAGNOSTIC STUDIES: Oxygen Saturation is 100% on RA, normal by my interpretation.    COORDINATION OF CARE: 3:44 PM-Discussed treatment plan which includes CT A/P, CMP, Lipase, CBC with pt at bedside and pt agreed to plan.   Labs Review Labs Reviewed  COMPREHENSIVE METABOLIC PANEL - Abnormal; Notable for the following:    Potassium 3.2 (*)    Calcium 8.5 (*)    All other components within normal limits  CBC WITH DIFFERENTIAL/PLATELET  URINALYSIS, ROUTINE W REFLEX MICROSCOPIC (NOT AT [facility])  PREGNANCY, URINE  LIPASE, BLOOD    Imaging Review Ct Abdomen Pelvis W Contrast  04/11/2015  CLINICAL DATA:  Left upper quadrant pain EXAM: CT ABDOMEN AND PELVIS WITH CONTRAST TECHNIQUE: Multidetector CT imaging of the abdomen and pelvis was performed using the standard protocol following bolus administration of intravenous contrast. CONTRAST:  25mL OMNIPAQUE IOHEXOL 300 MG/ML SOLN, OMNIPAQUE IOHEXOL 300 MG/ML SOLN COMPARISON:  11/08/2014 FINDINGS: Lower chest: No pleural or pericardial effusion identified. Subpleural nodule within the right lower lobe measures 11 mm and is unchanged from previous exam. Hepatobiliary: No focal liver abnormality. The gallbladder appears within normal limits. No biliary dilatation. Pancreas: Normal appearance of the pancreas. Spleen: Negative. Adrenals/Urinary Tract: The adrenal glands are normal. The kidneys are also unremarkable. Normal appearance of the urinary bladder. Stomach/Bowel: The stomach is normal. The small bowel loops have a normal course and caliber. The appendix is visualized and appears normal. No pathologic dilatation of the colon identified. Vascular/Lymphatic: Normal appearance of the abdominal aorta. No enlarged retroperitoneal or mesenteric adenopathy. No enlarged pelvic or inguinal lymph nodes. Reproductive: The uterus is unremarkable. Normal appearance of the left ovary. Cyst in  right ovary measures 2.7 cm and is new from previous exam. Other: No free fluid or fluid collections identified. Musculoskeletal: No acute bone abnormality noted. IMPRESSION: 1. No acute findings within the abdomen or pelvis to explain patient's epigastric abdominal pain. 2. Similar appearance of subpleural nodule in the right lower lobe measuring 11 mm. Electronically Signed   By: Signa Kell M.D.   On: 04/11/2015 16:27   I have personally reviewed and evaluated these images and lab results as part of my medical decision-making.   EKG Interpretation   Date/Time:  Monday April 11 2015 13:01:09 EST Ventricular Rate:  91 PR Interval:  166 QRS Duration: 94 QT Interval:  366 QTC Calculation: 450 R Axis:   -5 Text Interpretation:  Normal sinus rhythm Incomplete right bundle branch  block Borderline ECG No prior EKG for comparison Confirmed by LIU MD, Annabelle Harman  ([id number]) on 04/11/2015 1:05:50 PM      MDM   Final diagnoses:  Non-intractable vomiting with nausea, vomiting of unspecified type    24 y.o. female without pertinent PMH presents with n/v and constipation as above.  No sick contacts.  On arrival vitals and physical exam as above.  Although she initially complained of some chest pain in triage, this was only while actively vomiting and she denies frank pain outside of this and no dyspnea.  Wu unremarkable.  Likely  gastroenteritis.  DC home in stable condition.    I have reviewed all laboratory and imaging studies if ordered as above  1. Non-intractable vomiting with nausea, vomiting of unspecified type           Mirian MoMatthew Gentry, MD 04/11/15 [phone]

## 2015-04-11 NOTE — ED Notes (Signed)
MD at bedside. 

## 2015-04-11 NOTE — ED Notes (Signed)
Patient transported to CT 

## 2015-04-11 NOTE — ED Notes (Signed)
Per ems: Patient has left sided flank pain since last night. The patient reports that is radiating around to her abdominal area.

## 2015-04-11 NOTE — ED Notes (Signed)
Pt directed to pharmacy to pick up prescriptions- work note provided

## 2018-04-01 ENCOUNTER — Emergency Department (HOSPITAL_BASED_OUTPATIENT_CLINIC_OR_DEPARTMENT_OTHER): Payer: Medicaid Other

## 2018-04-01 ENCOUNTER — Emergency Department (HOSPITAL_BASED_OUTPATIENT_CLINIC_OR_DEPARTMENT_OTHER)
Admission: EM | Admit: 2018-04-01 | Discharge: 2018-04-01 | Disposition: A | Discharge status: Home / Self Care | Payer: Medicaid Other | Attending: Emergency Medicine | Admitting: Emergency Medicine

## 2018-04-01 ENCOUNTER — Other Ambulatory Visit: Payer: Self-pay

## 2018-04-01 ENCOUNTER — Encounter (HOSPITAL_BASED_OUTPATIENT_CLINIC_OR_DEPARTMENT_OTHER): Payer: Self-pay | Admitting: Emergency Medicine

## 2018-04-01 DIAGNOSIS — Y939 Activity, unspecified: Secondary | ICD-10-CM | POA: Insufficient documentation

## 2018-04-01 DIAGNOSIS — R51 Headache: Secondary | ICD-10-CM | POA: Insufficient documentation

## 2018-04-01 DIAGNOSIS — S299XXA Unspecified injury of thorax, initial encounter: Secondary | ICD-10-CM | POA: Insufficient documentation

## 2018-04-01 DIAGNOSIS — S0990XA Unspecified injury of head, initial encounter: Secondary | ICD-10-CM | POA: Diagnosis present

## 2018-04-01 DIAGNOSIS — Y929 Unspecified place or not applicable: Secondary | ICD-10-CM | POA: Insufficient documentation

## 2018-04-01 DIAGNOSIS — S3991XA Unspecified injury of abdomen, initial encounter: Secondary | ICD-10-CM | POA: Insufficient documentation

## 2018-04-01 DIAGNOSIS — F17200 Nicotine dependence, unspecified, uncomplicated: Secondary | ICD-10-CM | POA: Insufficient documentation

## 2018-04-01 DIAGNOSIS — Y999 Unspecified external cause status: Secondary | ICD-10-CM | POA: Insufficient documentation

## 2018-04-01 DIAGNOSIS — S022XXA Fracture of nasal bones, initial encounter for closed fracture: Secondary | ICD-10-CM | POA: Diagnosis not present

## 2018-04-01 LAB — BASIC METABOLIC PANEL
ANION GAP: 10 (ref 5–15)
BUN: 12 mg/dL (ref 6–20)
CALCIUM: 9.1 mg/dL (ref 8.9–10.3)
CO2: 22 mmol/L (ref 22–32)
CREATININE: 0.59 mg/dL (ref 0.44–1.00)
Chloride: 103 mmol/L (ref 98–111)
GFR calc non Af Amer: >60 mL/min (ref >60)
Glucose, Bld: 103 mg/dL — ABNORMAL HIGH (ref 70–99)
Potassium: 3.2 mmol/L — ABNORMAL LOW (ref 3.5–5.1)
SODIUM: 135 mmol/L (ref 135–145)

## 2018-04-01 LAB — CBC WITH DIFFERENTIAL/PLATELET
ABS IMMATURE GRANULOCYTES: 0.04 10*3/uL (ref 0.00–0.07)
BASOS ABS: 0.1 10*3/uL (ref 0.0–0.1)
Basophils Relative: 0 %
EOS PCT: 0 %
Eosinophils Absolute: 0 10*3/uL (ref 0.0–0.5)
HCT: 43.7 % (ref 36.0–46.0)
Hemoglobin: 14.3 g/dL (ref 12.0–15.0)
IMMATURE GRANULOCYTES: 0 %
LYMPHS ABS: 1.6 10*3/uL (ref 0.7–4.0)
Lymphocytes Relative: 13 %
MCH: 29.9 pg (ref 26.0–34.0)
MCHC: 32.7 g/dL (ref 30.0–36.0)
MCV: 91.4 fL (ref 80.0–100.0)
Monocytes Absolute: 1 10*3/uL (ref 0.1–1.0)
Monocytes Relative: 8 %
NEUTROS ABS: 9.4 10*3/uL — AB (ref 1.7–7.7)
NEUTROS PCT: 79 %
NRBC: 0 % (ref 0.0–0.2)
Platelets: 331 10*3/uL (ref 150–400)
RBC: 4.78 MIL/uL (ref 3.87–5.11)
RDW: 12.7 % (ref 11.5–15.5)
WBC: 12.1 10*3/uL — AB (ref 4.0–10.5)

## 2018-04-01 LAB — PREGNANCY, URINE: PREG TEST UR: NEGATIVE

## 2018-04-01 MED ORDER — FENTANYL CITRATE (PF) 100 MCG/2ML IJ SOLN
50.0000 ug | Freq: Once | INTRAMUSCULAR | Status: AC
  Administered 2018-04-01: 50 ug via INTRAVENOUS
  Filled 2018-04-01: qty 2

## 2018-04-01 MED ORDER — ONDANSETRON HCL 4 MG/2ML IJ SOLN
4.0000 mg | Freq: Once | INTRAMUSCULAR | Status: AC
  Administered 2018-04-01: 4 mg via INTRAVENOUS
  Filled 2018-04-01: qty 2

## 2018-04-01 MED ORDER — IOPAMIDOL (ISOVUE-300) INJECTION 61%
100.0000 mL | Freq: Once | INTRAVENOUS | Status: AC | PRN
  Administered 2018-04-01: 100 mL via INTRAVENOUS

## 2018-04-01 NOTE — Discharge Instructions (Addendum)
Take Tylenol and ibuprofen as needed for pain.  You can ice your face for 20 minutes at a time several times a day for swelling.

## 2018-04-01 NOTE — ED Provider Notes (Signed)
Imaging is negative except for nasal bone fracture.  Labs were reassuring.   Gwyneth Sprout, MD 04/01/18 [phone]

## 2018-04-01 NOTE — ED Notes (Signed)
2 attempts for PIV.  

## 2018-04-01 NOTE — ED Notes (Signed)
Pt states has a ride coming to get her and has a safe place to go
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
## 2022-07-10 ENCOUNTER — Inpatient Hospital Stay: Admit: 2022-07-10 | Discharge: 2022-07-10 | Disposition: A | Discharge status: Home / Self Care | Payer: Self-pay

## 2022-07-10 ENCOUNTER — Other Ambulatory Visit: Payer: Self-pay | Admitting: Gastroenterology

## 2022-07-23 ENCOUNTER — Encounter: Payer: Self-pay | Admitting: Gastroenterology

## 2022-08-06 ENCOUNTER — Other Ambulatory Visit: Payer: Self-pay

## 2022-08-06 ENCOUNTER — Encounter: Payer: Self-pay | Admitting: Orthopaedic Surgery

## 2022-08-06 ENCOUNTER — Ambulatory Visit: Payer: No Typology Code available for payment source | Admitting: Orthopaedic Surgery

## 2022-08-06 VITALS — BP 118/71 | HR 92 | Ht 63.0 in | Wt 280.0 lb

## 2022-08-06 DIAGNOSIS — M546 Pain in thoracic spine: Secondary | ICD-10-CM

## 2022-08-06 NOTE — Worker's Comp (Signed)
History of Present Illness    Audrey Barnes is seen in consultation today for thoracic spine pain . They were referred by Dr Boone Master.    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 The pain is located in her thoracic spine. She states it radiations around the left side of her torso. She reports some pain the radiates halfway down her left thigh. She denies any numbness or tingling. She denies any saddle anesthesia.      Non-operative care to date includes   - Tylenol   - PT     I reviewed allergies, medications, PMH, PSH, FH, SH, and Problem list at this encounter and documented  in eRecord.    Pertinent history notable for: None  Previous spine surgery: None     REVIEW OF SYSTEMS:   Noncontributory for head, eyes, ear, nose, and throat, cardiac, pulmonary, gastrointestinal, genitourinary, integumentary, psychiatric, endocrine, neurologic and musculoskeletal systems.       PHYSICAL EXAMINATION:   Vitals:    08/06/22 1330   BP: 118/71   Pulse: 92   Weight: 127 kg (280 lb)   Height: 1.6 m (5\' 3" )       Patient is well-nourished, well-appearing and in no distress. Sagittal alignment is within normal limits.  They ambulate with a normal, non-antalgic, non-Trendelenberg gait. They are able to heel and toe walk with good strength and coordination.    No tenderness to palpation in the midline or lumbar paraspinal muscle groups.   No pain with range of motion of the lumbar spine. Hip range of motion does not exacerbate symptoms.   5/5 strength in bilateral iliopsoas, quads, tibialis anterior, EHL and gastroc muscle groups.   Sensation is intact to light touch in the L3-S1 nerve distributions bilaterally.   Negative straight leg raise bilaterally.   Symmetric patellar and Achilles reflexes. Less than 3 beats of ankle clonus bilaterally.  Extremities are warm and well-perfused without evidence of peripheral edema.      IMAGING:    MRI performed on 07/10/2022  was personally reviewed. I also reviewed  the radiologist's report.   Mild disc bulge at T7-8, otherwise no severe central or neuroforaminal stenosis identified     ASSESSMENT AND PLAN:    Diagnosis:   Thoracic muscle sprain     Key Discussion:  We discussed that her condition is most likely a muscle sprain. We do not see any acute findings on MRI and discussed that here are no surgical options for her at this time.     Treatment Options and tests ordered:   We discussed continuing physical therapy, tylenol, and we provided her with a prescription to trail acupuncture.     Follow-up: PRN     Orders for Next Visit: Acupuncture     Zerita Boers, MD   Orthopaedic Spine Surgeon  [facility]    Voice recognition software was used to create this note. Please excuse any typos.

## 2022-10-05 ENCOUNTER — Other Ambulatory Visit: Payer: Self-pay

## 2022-10-05 ENCOUNTER — Ambulatory Visit: Payer: No Typology Code available for payment source | Admitting: Physical / Occupational Therapy

## 2022-10-05 DIAGNOSIS — M546 Pain in thoracic spine: Secondary | ICD-10-CM

## 2022-10-05 DIAGNOSIS — G8929 Other chronic pain: Secondary | ICD-10-CM

## 2022-10-05 NOTE — Progress Notes (Signed)
Acupuncture Initial Consultation     Patient: Audrey Barnes  MRN: [id number]  DOB: 1990/08/18    10/05/2022    Audrey Barnes is a 32 y.o. female who presents today for chief complaint(s) of back .      SUBJECTIVE / INQUIRY (Illness history, relevant symptoms as per below)    Onset: work injury  Duration: chronic   Severity: moderate     Audrey Barnes presents today with mid back pain. Pain is constant and is a burning feeling all the time. She states the entire back hurts. Injury happened at work. 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 Pain wraps around the sides to the front and will go down to the left hip and leg. Sitting for too long will make it really hard to get up. She rates the pain at an 8 most of the time when she is moving. She gets the burning feeling while sitting.       Record patient responses and disorders relating to the following interview topics:     Head & Body/Joint Pain: pain along the mid and lower back, goes into the left leg. 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     OBSERVATION / RELEVANT PHYSICAL FINDINGS (Physical Assessment)   Unremarkable                   ASSESSMENT:  TCM Diagnosis: qi and blood stagnation leading to pain in the back       PLAN  Treatment Principle: promote free flow of qi and blood to relieve pain       Treatment: (Points, Technique, Other)         Prone: bilateral     UX32, ub17  Ub18, ub20, ub22- stim for 15 min   Ub23, ub25  Ub52  Left: glute max     Time needles retained: 15 minutes  Number of needles used: 17  Time spent with patient (face to face contact): 30    [x]   Today's visit included acupuncture with the use of electrical stimulation        MInutes   Initial Time Based CPT  Initial Acupuncture  Initial Acupuncture with Electrical Stimulation    15    Subsequent Time Based CPT  Subsequent Unit of Acupuncture  Subsequent Unit of Acupuncture with Electrical Stimulation    15   Non-Billable Time:  Retained Needle Time, Heat, Ice, Rest 15   Time spent with patient (face to face contact): 30        Total Treatment Time 45         RECOMMENDATIONS /REFERRALS  Frequency of Treatment: 1 times per Week  Lifestyle Recommendations: use heat if sore, stay hydrated, take it easy after acupuncture  Objective/Goals: relieve pain in the back     Wynelle Cleveland, LAC

## 2024-02-28 ENCOUNTER — Encounter: Payer: Self-pay | Admitting: Internal Medicine
